# Patient Record
Sex: Female | Born: 2005 | Race: White | Hispanic: No | Smoking: Never smoker
Health system: Southern US, Community
[De-identification: ages and names within clinical notes are randomized; demographics above are authoritative.]

## PROBLEM LIST (undated history)

## (undated) DIAGNOSIS — R519 Headache, unspecified: Secondary | ICD-10-CM

## (undated) HISTORY — PX: DENTAL REHABILITATION: SHX1449

## (undated) HISTORY — PX: ESOPHAGOGASTRODUODENOSCOPY: SHX1529

---
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
## 2023-07-17 ENCOUNTER — Ambulatory Visit (INDEPENDENT_AMBULATORY_CARE_PROVIDER_SITE_OTHER)

## 2023-07-17 ENCOUNTER — Other Ambulatory Visit (HOSPITAL_COMMUNITY)
Admission: RE | Admit: 2023-07-17 | Discharge: 2023-07-17 | Disposition: A | Discharge status: Home / Self Care | Source: Ambulatory Visit | Attending: Obstetrics | Admitting: Obstetrics

## 2023-07-17 ENCOUNTER — Ambulatory Visit

## 2023-07-17 ENCOUNTER — Encounter: Payer: Self-pay | Admitting: Obstetrics

## 2023-07-17 VITALS — BP 121/77 | Ht 65.0 in | Wt 180.0 lb

## 2023-07-17 DIAGNOSIS — Z113 Encounter for screening for infections with a predominantly sexual mode of transmission: Secondary | ICD-10-CM | POA: Diagnosis present

## 2023-07-17 DIAGNOSIS — N898 Other specified noninflammatory disorders of vagina: Secondary | ICD-10-CM

## 2023-07-17 NOTE — Progress Notes (Signed)
    NURSE VISIT NOTE  Subjective:    Patient ID: Joanne Ramirez, female    DOB: 18-Apr-2005, 18 y.o.   MRN: [id number]  HPI  Patient is a 18 y.o. G34P1011 female who presents for white, creamy, malodorous, and thick vaginal discharge for a week(s). Denies abnormal vaginal bleeding or significant pelvic pain or fever. no uti SX . Patient denies history of known exposure to STD.   Objective:    Ht 5\' 5"  (1.651 m)   Wt 180 lb (81.6 kg)   Breastfeeding No   BMI 29.95 kg/m      Assessment:     Plan:   GC and chlamydia DNA  probe sent to lab. Treatment: abstain from coitus during course of treatment ROV prn if symptoms persist or worsen.   Gurkaran Rahm H German Manke, CMA

## 2023-07-19 ENCOUNTER — Encounter: Payer: Self-pay | Admitting: Obstetrics

## 2023-07-19 LAB — CERVICOVAGINAL ANCILLARY ONLY
Bacterial Vaginitis (gardnerella): NEGATIVE
Candida Glabrata: NEGATIVE
Candida Vaginitis: NEGATIVE
Chlamydia: NEGATIVE
Comment: NEGATIVE
Comment: NEGATIVE
Comment: NEGATIVE
Comment: NEGATIVE
Comment: NEGATIVE
Comment: NORMAL
Neisseria Gonorrhea: NEGATIVE
Trichomonas: NEGATIVE

## 2023-08-09 ENCOUNTER — Other Ambulatory Visit: Payer: Self-pay | Admitting: Physician Assistant

## 2023-08-09 DIAGNOSIS — R519 Headache, unspecified: Secondary | ICD-10-CM

## 2023-08-09 DIAGNOSIS — Q279 Congenital malformation of peripheral vascular system, unspecified: Secondary | ICD-10-CM

## 2023-08-14 ENCOUNTER — Encounter: Payer: Self-pay | Admitting: Radiology

## 2023-08-14 ENCOUNTER — Ambulatory Visit
Admission: RE | Admit: 2023-08-14 | Discharge: 2023-08-14 | Disposition: A | Discharge status: Home / Self Care | Source: Ambulatory Visit | Attending: Physician Assistant | Admitting: Physician Assistant

## 2023-08-14 DIAGNOSIS — R519 Headache, unspecified: Secondary | ICD-10-CM

## 2023-08-14 DIAGNOSIS — Q279 Congenital malformation of peripheral vascular system, unspecified: Secondary | ICD-10-CM

## 2023-08-14 MED ORDER — IOPAMIDOL (ISOVUE-370) INJECTION 76%
75.0000 mL | Freq: Once | INTRAVENOUS | Status: AC | PRN
  Administered 2023-08-14: 75 mL via INTRAVENOUS

## 2023-09-12 NOTE — Progress Notes (Addendum)
    GYNECOLOGY PROGRESS NOTE  Subjective:    Patient ID: Kyleena Statler, female    DOB: 03/31/2006, 18 y.o.   MRN: [id number]  HPI  Patient is a 18 y.o. G79P1011 female who presents for evaluation of pain during intercourse and to discuss methods of contraception. She has been having painful intercourse x 2 weeks. She has been having some vaginal itching, she denies vaginal discharge. Undecided about method of contraception. She has tried IUD and OCP's.   The following portions of the patient's history were reviewed and updated as appropriate: allergies, current medications, past family history, past medical history, past social history, past surgical history, and problem list.  Review of Systems Pertinent items noted in HPI and remainder of comprehensive ROS otherwise negative.   Objective:   Blood pressure 117/73, pulse 81, height 5' 5 (1.651 m), weight 180 lb 6.4 oz (81.8 kg), last menstrual period 08/23/2023. Body mass index is 30.02 kg/m. General appearance: alert, cooperative, and no distress Abdomen: soft, non-tender; bowel sounds normal; no masses,  no organomegaly Pelvic: positive findings: vaginal discharge:  white and thick    Assessment:   1. Dyspareunia in female   2. Vaginal itching   3. Encounter for initial prescription of contraceptive pills      Plan:   Will treat for yeast based on physical exam. Swab pending. Reviewed risk and benefits of all contraception types. Desires to restart OCPs. Reviewed how to take pills and what to do if she misses doses.     Donato Fu, CNM [facility]

## 2023-09-13 ENCOUNTER — Ambulatory Visit: Admitting: Certified Nurse Midwife

## 2023-09-13 ENCOUNTER — Other Ambulatory Visit (HOSPITAL_COMMUNITY)
Admission: RE | Admit: 2023-09-13 | Discharge: 2023-09-13 | Disposition: A | Discharge status: Home / Self Care | Source: Ambulatory Visit | Attending: Certified Nurse Midwife | Admitting: Certified Nurse Midwife

## 2023-09-13 VITALS — BP 117/73 | HR 81 | Ht 65.0 in | Wt 180.4 lb

## 2023-09-13 DIAGNOSIS — Z30011 Encounter for initial prescription of contraceptive pills: Secondary | ICD-10-CM | POA: Diagnosis not present

## 2023-09-13 DIAGNOSIS — N898 Other specified noninflammatory disorders of vagina: Secondary | ICD-10-CM

## 2023-09-13 DIAGNOSIS — N941 Unspecified dyspareunia: Secondary | ICD-10-CM | POA: Diagnosis present

## 2023-09-13 MED ORDER — NORGESTIMATE-ETH ESTRADIOL 0.25-35 MG-MCG PO TABS: 1.0000 | ORAL_TABLET | Freq: Every day | ORAL | 3 refills | Status: DC

## 2023-09-13 MED ORDER — FLUCONAZOLE 150 MG PO TABS: 150.0000 mg | ORAL_TABLET | Freq: Once | ORAL | 1 refills | Status: AC

## 2023-09-16 ENCOUNTER — Ambulatory Visit: Payer: Self-pay | Admitting: Certified Nurse Midwife

## 2023-09-16 ENCOUNTER — Other Ambulatory Visit: Payer: Self-pay | Admitting: Certified Nurse Midwife

## 2023-09-16 MED ORDER — METRONIDAZOLE 500 MG PO TABS: 500.0000 mg | ORAL_TABLET | Freq: Two times a day (BID) | ORAL | 0 refills | Status: AC

## 2024-03-05 ENCOUNTER — Ambulatory Visit

## 2024-03-05 VITALS — BP 121/81 | HR 78 | Ht 65.0 in | Wt 184.3 lb

## 2024-03-05 DIAGNOSIS — Z9189 Other specified personal risk factors, not elsewhere classified: Secondary | ICD-10-CM

## 2024-03-05 NOTE — Progress Notes (Signed)
    NURSE VISIT NOTE  Subjective:    Patient ID: Joanne Ramirez, female    DOB: 2005-05-24, 18 y.o.   MRN: [id number]  HPI  Patient is a 18 y.o. G63P1011 female who presents for std testing. She reports had unprotected intercourse with a new partner about 4 weeks ago.    Denies abnormal vaginal bleeding or significant pelvic pain or fever.     Objective:    BP 121/81   Pulse 78   Ht 5' 5 (1.651 m)   Wt 184 lb 4.8 oz (83.6 kg)   BMI 30.67 kg/m    No results found for any visits on 03/05/24.  Assessment:   1. At risk for sexually transmitted disease due to unprotected sex     nonspecific vaginitis  Plan:   GC and chlamydia DNA  probe sent to lab.  STD Labs ordered and patient scheduled appointment to have drawn tomorrow as it is too late to draw today. Treatment: Will await lab results. ROV prn if symptoms persist or worsen.   Camelia Bars, LPN

## 2024-03-05 NOTE — Patient Instructions (Signed)
 How to Have Safe Sex Having safe sex means taking steps before and during sex to reduce your risk of: Getting a sexually transmitted infection (STI). Giving your partner an STI. Unwanted or unplanned pregnancy. How to have safe sex Ways you can have safe sex  Limit your sex partners to only one partner who is only having sex with you. Avoid using alcohol and drugs before having sex. Alcohol and drugs can affect your judgment. Before having sex with a new partner: Talk to your partner about past partners, past STIs, and drug use. Get screened for STIs and discuss the results with your partner. Ask your partner to get screened too. Check your body regularly for sores, blisters, rashes, or unusual discharge. If you notice any of these things, call your health care provider. Avoid sexual contact if you have symptoms of an infection or you're being treated for an STI. While having sex, use a condom. Make sure to: Use a condom every time you have vaginal, oral, or anal sex. Both females and males should wear condoms during oral sex. Do not use a female condom and a female condom at the same time during vaginal sex. Using both types at the same time can cause condoms to break. Keep condoms in place from the beginning to the end of sexual activity. Use a latex condom, if possible. Latex condoms offer the best protection. Use only water-based lubricants with a condom. Using petroleum-based lubricants or oils will weaken the condom and increase the chance that it will break. Ways your health care provider can help you have safe sex  See your provider for regular screenings, exams, and tests for STIs. Talk with your provider about what kind of birth control is best for you. Get vaccinated against hepatitis B and human papillomavirus (HPV). If you're at risk of getting human immunodeficiency virus (HIV), talk with your provider about taking a medicine to prevent HIV. You're at risk for HIV if you: Are  a female who has sex with other males. Are sexually active with more than one partner. Take drugs by injection. Have a sex partner who has HIV. Have unprotected sex. Have sex with someone who has sex with both males and females. Follow these instructions at home: Take your medicines only as told. Call your provider if you think you might be pregnant. Call your provider if have any symptoms of an infection. Where to find more information Centers for Disease Control and Prevention (CDC): TonerPromos.no Office on Women's Health: TravelLess[employer].ca This information is not intended to replace advice given to you by your health care provider. Make sure you discuss any questions you have with your health care provider. Document Revised: 08/10/2022 Document Reviewed: 08/10/2022 Elsevier Patient Education  2024 [employer].

## 2024-03-06 ENCOUNTER — Other Ambulatory Visit (HOSPITAL_COMMUNITY)
Admission: RE | Admit: 2024-03-06 | Discharge: 2024-03-06 | Disposition: A | Source: Ambulatory Visit | Attending: Certified Nurse Midwife | Admitting: Certified Nurse Midwife

## 2024-03-06 ENCOUNTER — Other Ambulatory Visit

## 2024-03-06 DIAGNOSIS — Z9189 Other specified personal risk factors, not elsewhere classified: Secondary | ICD-10-CM | POA: Diagnosis present

## 2024-03-06 NOTE — Addendum Note (Signed)
 Addended by: TAFT CAMELIA MATSU on: 03/06/2024 08:50 AM   Modules accepted: Orders

## 2024-03-07 LAB — HEPATITIS B SURFACE ANTIGEN: Hepatitis B Surface Ag: NEGATIVE

## 2024-03-07 LAB — HEPATITIS C ANTIBODY: Hep C Virus Ab: NONREACTIVE

## 2024-03-07 LAB — HIV ANTIBODY (ROUTINE TESTING W REFLEX): HIV Screen 4th Generation wRfx: NONREACTIVE

## 2024-03-12 LAB — CERVICOVAGINAL ANCILLARY ONLY
Chlamydia: NEGATIVE
Comment: NEGATIVE
Comment: NORMAL
Comment: NORMAL
Neisseria Gonorrhea: NEGATIVE
Trichomonas: NEGATIVE

## 2024-03-13 ENCOUNTER — Ambulatory Visit: Payer: Self-pay | Admitting: Certified Nurse Midwife

## 2024-04-17 ENCOUNTER — Ambulatory Visit

## 2024-04-17 VITALS — BP 125/89 | HR 74 | Temp 97.9°F | Resp 16 | Ht 65.0 in | Wt 187.2 lb

## 2024-04-17 DIAGNOSIS — R300 Dysuria: Secondary | ICD-10-CM

## 2024-04-17 LAB — POCT URINALYSIS DIPSTICK
Bilirubin, UA: NEGATIVE
Blood, UA: POSITIVE
Glucose, UA: NEGATIVE
Ketones, UA: NEGATIVE
Nitrite, UA: NEGATIVE
Protein, UA: POSITIVE — AB
Spec Grav, UA: 1.015
Urobilinogen, UA: 0.2 U/dL
pH, UA: 5

## 2024-04-17 MED ORDER — NITROFURANTOIN MONOHYD MACRO 100 MG PO CAPS: 100.0000 mg | ORAL_CAPSULE | Freq: Two times a day (BID) | ORAL | 0 refills | Status: AC

## 2024-04-17 NOTE — Progress Notes (Addendum)
" ° ° °  NURSE VISIT NOTE  Subjective:    Patient ID: Joanne Ramirez, female    DOB: 08/27/2005, 19 y.o.   MRN: [id number]       HPI  Patient is a 19 y.o. G57P1011 female who presents for dysuria for 1 day.  Patient denies urinary frequency, urinary urgency, flank pain, and pelvic pain.  Patient does have a history of recurrent UTI.  Patient does have a history of pyelonephritis.    Objective:    BP 125/89   Pulse 74   Temp 97.9 F (36.6 C) (Oral)   Resp 16   Ht 5' 5 (1.651 m)   Wt 187 lb 3.2 oz (84.9 kg)   BMI 31.15 kg/m    Lab Review  Results for orders placed or performed in visit on 04/17/24  POCT Urinalysis Dipstick  Result Value Ref Range   Color, UA     Clarity, UA     Glucose, UA Negative Negative   Bilirubin, UA NEGATIVE    Ketones, UA NEGATIVE    Spec Grav, UA 1.015 1.010 - 1.025   Blood, UA POSITIVE    pH, UA 5.0 5.0 - 8.0   Protein, UA Positive (A) Negative   Urobilinogen, UA 0.2 0.2 or 1.0 E.U./dL   Nitrite, UA NEGATIVE    Leukocytes, UA Moderate (2+) (A) Negative   Appearance     Odor      Assessment:   1. Dysuria      Plan:   Urine Culture Sent. Treatment  Macrobid  100 mg PO BID for 7 days. Maintain adequate hydration.  Follow up if symptoms worsen or fail to improve as anticipated, and as needed.       Camelia Fetters, CMA  [facility]

## 2024-04-17 NOTE — Patient Instructions (Signed)

## 2024-04-19 ENCOUNTER — Encounter: Payer: Self-pay | Admitting: Certified Nurse Midwife

## 2024-04-19 ENCOUNTER — Other Ambulatory Visit: Payer: Self-pay

## 2024-04-19 DIAGNOSIS — N390 Urinary tract infection, site not specified: Secondary | ICD-10-CM

## 2024-04-19 LAB — URINE CULTURE

## 2024-04-22 ENCOUNTER — Ambulatory Visit: Payer: Self-pay

## 2024-04-22 ENCOUNTER — Encounter: Payer: Self-pay | Admitting: Certified Nurse Midwife

## 2024-04-24 ENCOUNTER — Ambulatory Visit

## 2024-04-24 ENCOUNTER — Other Ambulatory Visit (HOSPITAL_COMMUNITY)
Admission: RE | Admit: 2024-04-24 | Discharge: 2024-04-24 | Disposition: A | Discharge status: Home / Self Care | Source: Ambulatory Visit | Attending: Certified Nurse Midwife | Admitting: Certified Nurse Midwife

## 2024-04-24 VITALS — BP 111/70 | HR 83 | Wt 187.9 lb

## 2024-04-24 DIAGNOSIS — N898 Other specified noninflammatory disorders of vagina: Secondary | ICD-10-CM | POA: Diagnosis present

## 2024-04-24 NOTE — Progress Notes (Addendum)
" ° ° °  NURSE VISIT NOTE  Subjective:    Patient ID: Joanne Ramirez, female    DOB: 2006-02-06, 19 y.o.   MRN: [id number]  HPI  Patient is a 19 y.o. G2P1011 female who presents for Vaginal irritation and malodorous vaginal discharge, for 1 week(s). Denies abnormal vaginal bleeding or significant pelvic pain or fever. admits to dysuria. Patient does have a history of known exposure to STD.   Objective:    BP 111/70   Pulse 83   Wt 187 lb 14.4 oz (85.2 kg)   LMP 04/13/2024 (Exact Date)   BMI 31.27 kg/m    No results found for any visits on 04/24/24.  Assessment:   1. Vaginal itching   2. Vaginal odor     nonspecific vaginitis  Plan:   GC and chlamydia DNA  probe sent to lab. ROV prn if symptoms persist or worsen.   Shawnika Pepin  "

## 2024-04-25 LAB — CERVICOVAGINAL ANCILLARY ONLY
Bacterial Vaginitis (gardnerella): NEGATIVE
Candida Glabrata: NEGATIVE
Candida Vaginitis: NEGATIVE
Chlamydia: NEGATIVE
Comment: NEGATIVE
Comment: NEGATIVE
Comment: NEGATIVE
Comment: NEGATIVE
Comment: NEGATIVE
Comment: NORMAL
Neisseria Gonorrhea: NEGATIVE
Trichomonas: NEGATIVE

## 2024-04-26 ENCOUNTER — Ambulatory Visit: Payer: Self-pay | Admitting: Certified Nurse Midwife

## 2024-05-03 NOTE — Telephone Encounter (Signed)
 Please advise.

## 2024-05-08 ENCOUNTER — Other Ambulatory Visit: Payer: Self-pay | Admitting: Certified Nurse Midwife

## 2024-05-08 MED ORDER — OXYBUTYNIN CHLORIDE ER 5 MG PO TB24: 5.0000 mg | ORAL_TABLET | Freq: Every day | ORAL | 1 refills | Status: AC
